# Patient Record
Sex: Female | Born: 1995 | Race: Black or African American | Hispanic: No | Marital: Single | Smoking: Never smoker
Health system: Southern US, Community
[De-identification: ages and names within clinical notes are randomized; demographics above are authoritative.]

---
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
## 2016-12-18 DIAGNOSIS — E669 Obesity, unspecified: Secondary | ICD-10-CM | POA: Insufficient documentation

## 2019-09-03 ENCOUNTER — Encounter: Payer: Self-pay | Admitting: Gerontology

## 2019-09-03 ENCOUNTER — Other Ambulatory Visit: Payer: Self-pay

## 2019-09-03 ENCOUNTER — Ambulatory Visit: Payer: Self-pay | Admitting: Gerontology

## 2019-09-03 VITALS — BP 115/70 | HR 91 | Ht 64.5 in | Wt 263.0 lb

## 2019-09-03 DIAGNOSIS — N898 Other specified noninflammatory disorders of vagina: Secondary | ICD-10-CM

## 2019-09-03 DIAGNOSIS — Z7689 Persons encountering health services in other specified circumstances: Secondary | ICD-10-CM

## 2019-09-03 NOTE — Progress Notes (Signed)
Patient ID: Vicki Guzman, female   DOB: 01-Nov-1995, 24 y.o.   MRN: [id number]  Chief Complaint  Patient presents with  . Establish Care  . Vaginal Discharge    HPI Vicki Guzman is a 24 y.o. female who presents to establish care. She reports that she's been having no malodourous copious amount of thick milky white vaginal discharge that started yesterday. She denies vaginal itch, dysuria, and pelvic pain. She states that she's sexually active with one partner and uses barrier protection. She states that she has not had Pap smear done. Overall, she states that she's doing well and offers no further complaint.  History reviewed. No pertinent past medical history.    History reviewed. No pertinent family history.  Social History Social History   Tobacco Use  . Smoking status: Never Smoker  . Smokeless tobacco: Never Used  Vaping Use  . Vaping Use: Never used  Substance Use Topics  . Alcohol use: Not Currently  . Drug use: Never    No Known Allergies  No current outpatient medications on file.   No current facility-administered medications for this visit.    Review of Systems Review of Systems  Constitutional: Negative.   HENT: Negative.   Eyes: Negative.   Respiratory: Negative.   Cardiovascular: Negative.   Gastrointestinal: Negative.   Endocrine: Negative.   Genitourinary: Positive for vaginal discharge. Negative for dysuria, flank pain, frequency, hematuria, pelvic pain, urgency, vaginal bleeding and vaginal pain.  Musculoskeletal: Negative.   Skin: Negative.   Allergic/Immunologic: Negative.   Neurological: Negative.   Hematological: Negative.   Psychiatric/Behavioral: Negative.     Blood pressure 115/70, pulse 91, height 5' 4.5" (1.638 m), weight 263 lb (119.3 kg), SpO2 98 %.  She was encouraged to continue on a weight loss regimen. Physical Exam Physical Exam Constitutional:      Appearance: Normal appearance.  HENT:     Head: Normocephalic and  atraumatic.     Nose:     Comments: Deferred per Covid protocol    Mouth/Throat:     Comments: Deferred per Covid protocol Eyes:     Extraocular Movements: Extraocular movements intact.     Pupils: Pupils are equal, round, and reactive to light.  Cardiovascular:     Rate and Rhythm: Normal rate and regular rhythm.     Pulses: Normal pulses.     Heart sounds: Normal heart sounds.  Pulmonary:     Effort: Pulmonary effort is normal.     Breath sounds: Normal breath sounds.  Abdominal:     General: Abdomen is flat. Bowel sounds are normal.     Palpations: Abdomen is soft.     Tenderness: There is no right CVA tenderness or left CVA tenderness.  Genitourinary:    Comments: Deferred per patient Musculoskeletal:        General: Normal range of motion.     Cervical back: Normal range of motion.  Skin:    General: Skin is warm and dry.  Neurological:     General: No focal deficit present.     Mental Status: She is alert and oriented to person, place, and time. Mental status is at baseline.  Psychiatric:        Mood and Affect: Mood normal.        Behavior: Behavior normal.        Thought Content: Thought content normal.        Judgment: Judgment normal.     Data Reviewed Past medical history was  reviewed.  Assessment and Plan  1. Encounter to establish care - Routine labs will be checked - CBC w/Diff; Future - Comp Met (CMET); Future - Urinalysis; Future - TSH; Future - Lipid panel; Future - HgB A1c; Future  2. Vaginal discharge - She was provided with [place] information, encouraged to call and schedule an appointment for Vaginal discharge evaluation and Pap smear screening.   Follow up: 12/03/2019 or if symptoms worsen or fails to improve.   Vicki Guzman 09/03/2019, 1:18 PM

## 2019-09-04 ENCOUNTER — Ambulatory Visit: Payer: Managed Care, Other (non HMO) | Admitting: Physician Assistant

## 2019-09-04 DIAGNOSIS — N760 Acute vaginitis: Secondary | ICD-10-CM

## 2019-09-04 DIAGNOSIS — Z113 Encounter for screening for infections with a predominantly sexual mode of transmission: Secondary | ICD-10-CM

## 2019-09-04 DIAGNOSIS — B9689 Other specified bacterial agents as the cause of diseases classified elsewhere: Secondary | ICD-10-CM

## 2019-09-04 MED ORDER — METRONIDAZOLE 500 MG PO TABS: 500.0000 mg | ORAL_TABLET | Freq: Two times a day (BID) | ORAL | 0 refills | Status: AC

## 2019-09-04 NOTE — Progress Notes (Signed)
Wet mount reviewed by provider, pt treated for BV per provider orders. Provider orders completed. 

## 2019-09-06 ENCOUNTER — Encounter: Payer: Self-pay | Admitting: Physician Assistant

## 2019-09-06 NOTE — Progress Notes (Signed)
[facility] Department STI clinic/screening visit  Subjective:  Vicki Guzman is a 24 y.o. female being seen today for an STI screening visit. The patient reports they do have symptoms.  Patient reports that they do not desire a pregnancy in the next year.   They reported they are not interested in discussing contraception today.  No LMP recorded (exact date).   Patient has the following medical conditions:   Patient Active Problem List   Diagnosis Date Noted  . Encounter to establish care 09/03/2019  . Vaginal discharge 09/03/2019  . Obesity, unspecified 12/18/2016    Chief Complaint  Patient presents with  . SEXUALLY TRANSMITTED DISEASE    screening    HPI  Patient reports that she has had a "milky" white discharge with odor for 3 days.  Denies other symptoms, chronic conditions, surgeries and regular medications.  LMP was 08/21/2019.  States that she has not had a pap yet or a HIV test in the past.  Reports that she has an appt with PCP for PE and pap within the next week or so.   See flowsheet for further details and programmatic requirements.    The following portions of the patient's history were reviewed and updated as appropriate: allergies, current medications, past medical history, past social history, past surgical history and problem list.  Objective:  There were no vitals filed for this visit.  Physical Exam Constitutional:      General: She is not in acute distress.    Appearance: Normal appearance.  HENT:     Head: Normocephalic and atraumatic.     Comments: No nits, lice, or hair loss. No cervical, supraclavicular or axillary adenopathy.    Mouth/Throat:     Mouth: Mucous membranes are moist.     Pharynx: Oropharynx is clear. No oropharyngeal exudate or posterior oropharyngeal erythema.  Eyes:     Conjunctiva/sclera: Conjunctivae normal.  Pulmonary:     Effort: Pulmonary effort is normal.  Abdominal:     Palpations: Abdomen is soft. There  is no mass.     Tenderness: There is no abdominal tenderness. There is no guarding or rebound.  Genitourinary:    General: Normal vulva.     Rectum: Normal.     Comments: External genitalia/pubic area without nits, lice, edema, erythema, lesions and inguinal adenopathy. Vagina with normal mucosa and small amount of thin, white discharge, pH=>4.5. Cervix without visible lesions. Uterus firm, mobile, nt, no masses, no CMT, no adnexal tenderness or fullness. Musculoskeletal:     Cervical back: Neck supple. No tenderness.  Skin:    General: Skin is warm and dry.     Findings: No bruising, erythema, lesion or rash.  Neurological:     Mental Status: She is alert and oriented to person, place, and time.  Psychiatric:        Mood and Affect: Mood normal.        Behavior: Behavior normal.        Thought Content: Thought content normal.        Judgment: Judgment normal.      Assessment and Plan:  Vicki Guzman is a 24 y.o. female presenting to the [facility] Department for STI screening  1. Screening for STD (sexually transmitted disease) Patient into clinic with symptoms. Rec condoms with all sex. Await test results.  Counseled that RN will call if needs to RTC for further treatment once results are back.  - WET PREP FOR TRICH, YEAST, CLUE -  Chlamydia/Gonorrhea [place] [facility] - HIV [place] [facility] - Syphilis Serology, [place] [facility] - Gonococcus culture  2. BV (bacterial vaginosis) Treat for BV with Metronidazole 500mg  #14 1 po BID for 7 days with food, no EtOH for 24 hr before and until 72 hr after completing medicine. No sex for 7 days. Rec OTC antifungal cream if has itching during or just after treatment with antibiotics. - metroNIDAZOLE (FLAGYL) 500 MG tablet; Take 1 tablet (500 mg total) by mouth 2 (two) times daily for 7 days.  Dispense: 14 tablet; Refill: 0     No follow-ups on file.  Future Appointments  Date Time Provider [place]  09/07/2019  8:40  AM [facility] PROVIDER [facility] None  09/23/2019  9:45 AM [facility] NURSE [facility] None  12/03/2019  9:30 AM [place], Lonny Prude, NP [facility] None    Jerene Dilling, PA

## 2019-09-07 ENCOUNTER — Ambulatory Visit: Payer: Self-pay

## 2019-09-07 LAB — WET PREP FOR TRICH, YEAST, CLUE
Trichomonas Exam: NEGATIVE
Yeast Exam: NEGATIVE

## 2019-09-08 ENCOUNTER — Ambulatory Visit: Payer: Self-pay

## 2019-09-09 LAB — GONOCOCCUS CULTURE

## 2019-09-10 ENCOUNTER — Telehealth: Payer: Self-pay | Admitting: Family Medicine

## 2019-09-10 NOTE — Telephone Encounter (Signed)
Call to patient to discuss + test results, no answer, unable to [facility]

## 2019-09-23 ENCOUNTER — Other Ambulatory Visit: Payer: Self-pay

## 2019-11-18 ENCOUNTER — Ambulatory Visit: Payer: Self-pay

## 2019-11-18 ENCOUNTER — Ambulatory Visit: Payer: Managed Care, Other (non HMO) | Admitting: Advanced Practice Midwife

## 2019-11-18 ENCOUNTER — Encounter: Payer: Self-pay | Admitting: Advanced Practice Midwife

## 2019-11-18 ENCOUNTER — Other Ambulatory Visit: Payer: Self-pay

## 2019-11-18 DIAGNOSIS — Z113 Encounter for screening for infections with a predominantly sexual mode of transmission: Secondary | ICD-10-CM

## 2019-11-18 LAB — WET PREP FOR TRICH, YEAST, CLUE
Trichomonas Exam: NEGATIVE
Yeast Exam: NEGATIVE

## 2019-11-18 MED ORDER — METRONIDAZOLE 500 MG PO TABS: 500.0000 mg | ORAL_TABLET | Freq: Two times a day (BID) | ORAL | 0 refills | Status: AC

## 2019-11-18 NOTE — Progress Notes (Signed)
Patient here for STD testing.Vicki Klausing Brewer-Jensen, RN 

## 2019-11-18 NOTE — Progress Notes (Signed)
[employer] results reviewed by provider Hazle Coca, CNM. Patient treated for BV per standing orders. Tawny Hopping, RN

## 2019-11-18 NOTE — Progress Notes (Signed)
[facility] Department STI clinic/screening visit  Subjective:  Vicki Guzman is a 24 y.o. SBF nullip nonsmoker female being seen today for an STI screening visit. The patient reports they do have symptoms.  Patient reports that they do not desire a pregnancy in the next year.   They reported they are not interested in discussing contraception today.  Patient's last menstrual period was 10/29/2019 (approximate).   Patient has the following medical conditions:   Patient Active Problem List   Diagnosis Date Noted  . Encounter to establish care 09/03/2019  . Vaginal discharge 09/03/2019  . Obesity, 256 lbs 12/18/2016    Chief Complaint  Patient presents with  . Exposure to STD    HPI  Patient reports last sex 11/13/19 without condom; with current partner x 3 mo.  Last EOTH 11/06/19 (3 glasses wine) on holidays/birthdays.  LMP 10/30/19.  C/o increased yellow d/c with labial edema since 11/15/19 and dysuria yesterday with clitoris tenderness  Last HIV test per patient/review of record was 09/04/19 Patient reports last pap was never  See flowsheet for further details and programmatic requirements.    The following portions of the patient's history were reviewed and updated as appropriate: allergies, current medications, past medical history, past social history, past surgical history and problem list.  Objective:  There were no vitals filed for this visit.  Physical Exam Vitals and nursing note reviewed.  Constitutional:      Appearance: Normal appearance. She is obese.  HENT:     Head: Normocephalic and atraumatic.     Mouth/Throat:     Mouth: Mucous membranes are moist.     Pharynx: Oropharynx is clear. No oropharyngeal exudate or posterior oropharyngeal erythema.  Eyes:     Conjunctiva/sclera: Conjunctivae normal.  Pulmonary:     Effort: Pulmonary effort is normal.  Abdominal:     Palpations: Abdomen is soft. There is no mass.     Tenderness: There is no  abdominal tenderness. There is no rebound.     Comments: Poor tone, soft without tenderness, increased adipose  Genitourinary:    General: Normal vulva.     Exam position: Lithotomy position.     Pubic Area: No rash or pubic lice.      Labia:        Right: No rash or lesion.        Left: No rash or lesion.      Vagina: Vaginal discharge (watery increased grey leukorrhea dripping onto table, ph>4.5) present. No erythema, bleeding or lesions.     Cervix: Normal.     Uterus: Normal.      Adnexa: Right adnexa normal and left adnexa normal.     Rectum: Normal.       Comments: Possible sebaceous cyst in middle of right labia majora Clitoris with 2 small erythematous irritated areas Posterior forchette with 3 open shallow lesions--HSV culture done on this and clitoris Lymphadenopathy:     Head:     Right side of head: No preauricular or posterior auricular adenopathy.     Left side of head: No preauricular or posterior auricular adenopathy.     Cervical: No cervical adenopathy.     Upper Body:     Right upper body: No supraclavicular or axillary adenopathy.     Left upper body: No supraclavicular or axillary adenopathy.     Lower Body: No right inguinal adenopathy. No left inguinal adenopathy.  Skin:    General: Skin is warm and dry.  Findings: No rash.  Neurological:     Mental Status: She is alert and oriented to person, place, and time.      Assessment and Plan:  CONNOR FOXWORTHY is a 24 y.o. female presenting to the [facility] Department for STI screening  1. Screening examination for venereal disease Treat wet mount per standing orders Immunization nurse consult  - HIV [place] [facility] - Gonococcus culture - HIV/HCV [place] [facility] - Syphilis Serology, [place] [facility] - Chlamydia/Gonorrhea [place] [facility] - Virology, [place] [facility] - HBV Antigen/Antibody [place] [facility] - WET PREP FOR TRICH, YEAST, CLUE     Return if symptoms worsen or fail to improve.  Future  Appointments  Date Time Provider Department Center  12/03/2019  9:30 AM Rolm Gala, NP [facility] None    Alberteen Spindle, CNM

## 2019-11-23 LAB — GONOCOCCUS CULTURE

## 2019-11-25 ENCOUNTER — Encounter: Payer: Self-pay | Admitting: Family Medicine

## 2019-11-25 LAB — HM HIV SCREENING LAB: HM HIV Screening: NEGATIVE

## 2019-11-25 LAB — HEPATITIS B SURFACE ANTIGEN

## 2019-11-25 LAB — HM HEPATITIS C SCREENING LAB: HM Hepatitis Screen: NEGATIVE

## 2019-11-27 ENCOUNTER — Telehealth: Payer: Self-pay | Admitting: Family Medicine

## 2019-11-27 ENCOUNTER — Other Ambulatory Visit: Payer: Self-pay

## 2019-11-27 ENCOUNTER — Other Ambulatory Visit: Payer: Self-pay | Admitting: Physician Assistant

## 2019-11-27 ENCOUNTER — Ambulatory Visit: Payer: Managed Care, Other (non HMO)

## 2019-11-27 DIAGNOSIS — A749 Chlamydial infection, unspecified: Secondary | ICD-10-CM

## 2019-11-27 DIAGNOSIS — B009 Herpesviral infection, unspecified: Secondary | ICD-10-CM

## 2019-11-27 MED ORDER — ACYCLOVIR 800 MG PO TABS: 800.0000 mg | ORAL_TABLET | Freq: Two times a day (BID) | ORAL | 12 refills | Status: AC

## 2019-11-27 NOTE — Telephone Encounter (Signed)
TC with patient.  Verified ID via password.  Discussed +HSV 2 results, disease process and treatment options. Questions and concerns addressed. Patient verbalized understanding of results. Requests Acyclovir be called into [facility] [address] in [place].  Explained treatment options suppressive vs. Episodically.  Patient wants episodic treatment.   Wendi Snipes, RN

## 2019-11-27 NOTE — Progress Notes (Signed)
Per note from RN after discussing results with patient.  Patient requests Rx for episodic treatment be sent to her pharmacy[facility] [place].  Rx for Acyclovir 800 mg #10 1 po BID for 5 days with refills for 1 year sent to pharmacy.

## 2019-12-01 DIAGNOSIS — A749 Chlamydial infection, unspecified: Secondary | ICD-10-CM

## 2019-12-01 MED ORDER — AZITHROMYCIN 500 MG PO TABS
1000.0000 mg | ORAL_TABLET | Freq: Once | ORAL | Status: AC
  Administered 2019-12-01: 1000 mg via ORAL

## 2019-12-03 ENCOUNTER — Ambulatory Visit: Payer: Self-pay | Admitting: Gerontology

## 2020-02-05 ENCOUNTER — Ambulatory Visit: Payer: Managed Care, Other (non HMO)

## 2020-02-08 ENCOUNTER — Ambulatory Visit: Payer: Managed Care, Other (non HMO)

## 2020-02-09 NOTE — Addendum Note (Signed)
Addended by: Heywood Bene on: 02/09/2020 11:03 AM   Modules accepted: Orders

## 2020-12-05 ENCOUNTER — Emergency Department
Admission: EM | Admit: 2020-12-05 | Discharge: 2020-12-05 | Disposition: A | Discharge status: Home / Self Care | Payer: No Typology Code available for payment source | Attending: Emergency Medicine | Admitting: Emergency Medicine

## 2020-12-05 ENCOUNTER — Emergency Department: Payer: No Typology Code available for payment source

## 2020-12-05 ENCOUNTER — Other Ambulatory Visit: Payer: Self-pay

## 2020-12-05 DIAGNOSIS — S34109A Unspecified injury to unspecified level of lumbar spinal cord, initial encounter: Secondary | ICD-10-CM | POA: Diagnosis present

## 2020-12-05 DIAGNOSIS — S8002XA Contusion of left knee, initial encounter: Secondary | ICD-10-CM | POA: Diagnosis not present

## 2020-12-05 DIAGNOSIS — Y92410 Unspecified street and highway as the place of occurrence of the external cause: Secondary | ICD-10-CM | POA: Diagnosis not present

## 2020-12-05 DIAGNOSIS — S39012A Strain of muscle, fascia and tendon of lower back, initial encounter: Secondary | ICD-10-CM | POA: Diagnosis not present

## 2020-12-05 LAB — POC URINE PREG, ED: Preg Test, Ur: NEGATIVE

## 2020-12-05 MED ORDER — MELOXICAM 15 MG PO TABS: 15.0000 mg | ORAL_TABLET | Freq: Every day | ORAL | 0 refills | Status: DC

## 2020-12-05 MED ORDER — METHOCARBAMOL 500 MG PO TABS: 500.0000 mg | ORAL_TABLET | Freq: Four times a day (QID) | ORAL | 0 refills | Status: DC
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
## 2020-12-09 ENCOUNTER — Emergency Department
Admission: EM | Admit: 2020-12-09 | Discharge: 2020-12-09 | Disposition: A | Discharge status: Home / Self Care | Payer: No Typology Code available for payment source | Attending: Emergency Medicine | Admitting: Emergency Medicine

## 2020-12-09 ENCOUNTER — Other Ambulatory Visit: Payer: Self-pay

## 2020-12-09 ENCOUNTER — Emergency Department: Payer: No Typology Code available for payment source

## 2020-12-09 DIAGNOSIS — Y92410 Unspecified street and highway as the place of occurrence of the external cause: Secondary | ICD-10-CM | POA: Diagnosis not present

## 2020-12-09 DIAGNOSIS — Z23 Encounter for immunization: Secondary | ICD-10-CM | POA: Diagnosis not present

## 2020-12-09 DIAGNOSIS — M79672 Pain in left foot: Secondary | ICD-10-CM

## 2020-12-09 DIAGNOSIS — S91312A Laceration without foreign body, left foot, initial encounter: Secondary | ICD-10-CM | POA: Diagnosis present

## 2020-12-09 MED ORDER — TETANUS-DIPHTH-ACELL PERTUSSIS 5-2.5-18.5 LF-MCG/0.5 IM SUSY
0.5000 mL | PREFILLED_SYRINGE | Freq: Once | INTRAMUSCULAR | Status: AC
  Administered 2020-12-09: 0.5 mL via INTRAMUSCULAR
  Filled 2020-12-09: qty 0.5
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
## 2022-05-19 IMAGING — CR DG FOOT COMPLETE 3+V*L*
1 series · 3 of 3 positions shown · non-contrast
Comparison: None.

CLINICAL DATA: Left foot pain.  Pinky toe pain.

EXAM:
LEFT FOOT - COMPLETE 3+ VIEW

[Series 1: dg foot complete left · 0.14mm/px · 3 of 3 slices shown]
[im 1/3]
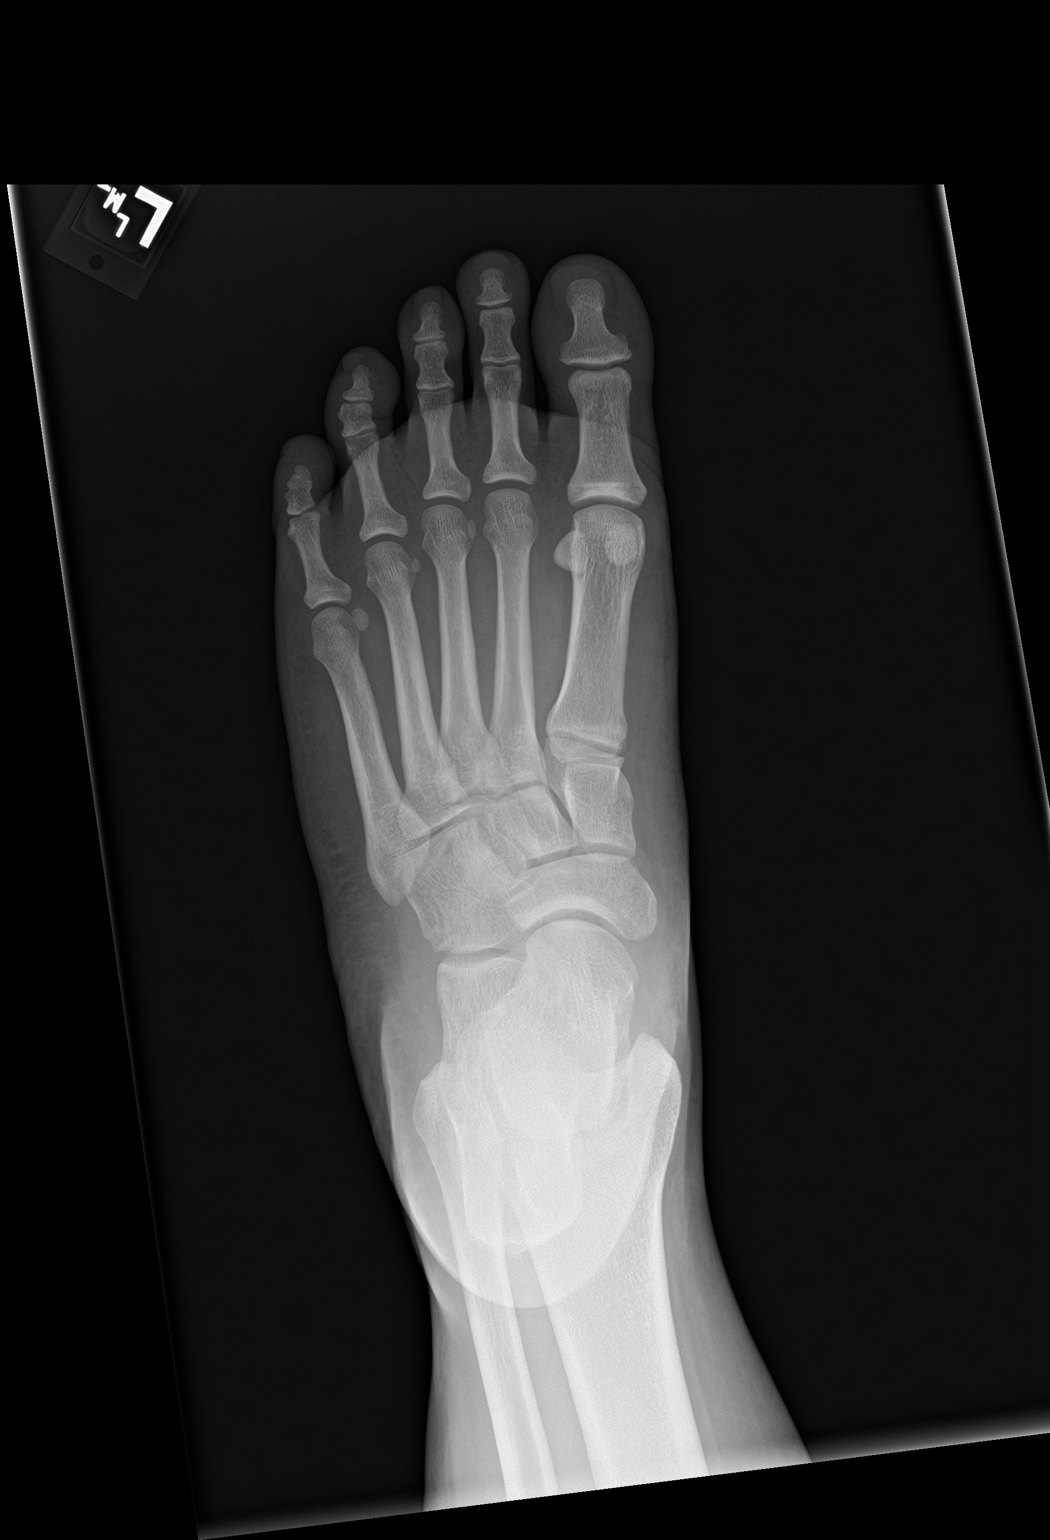
[im 2/3]
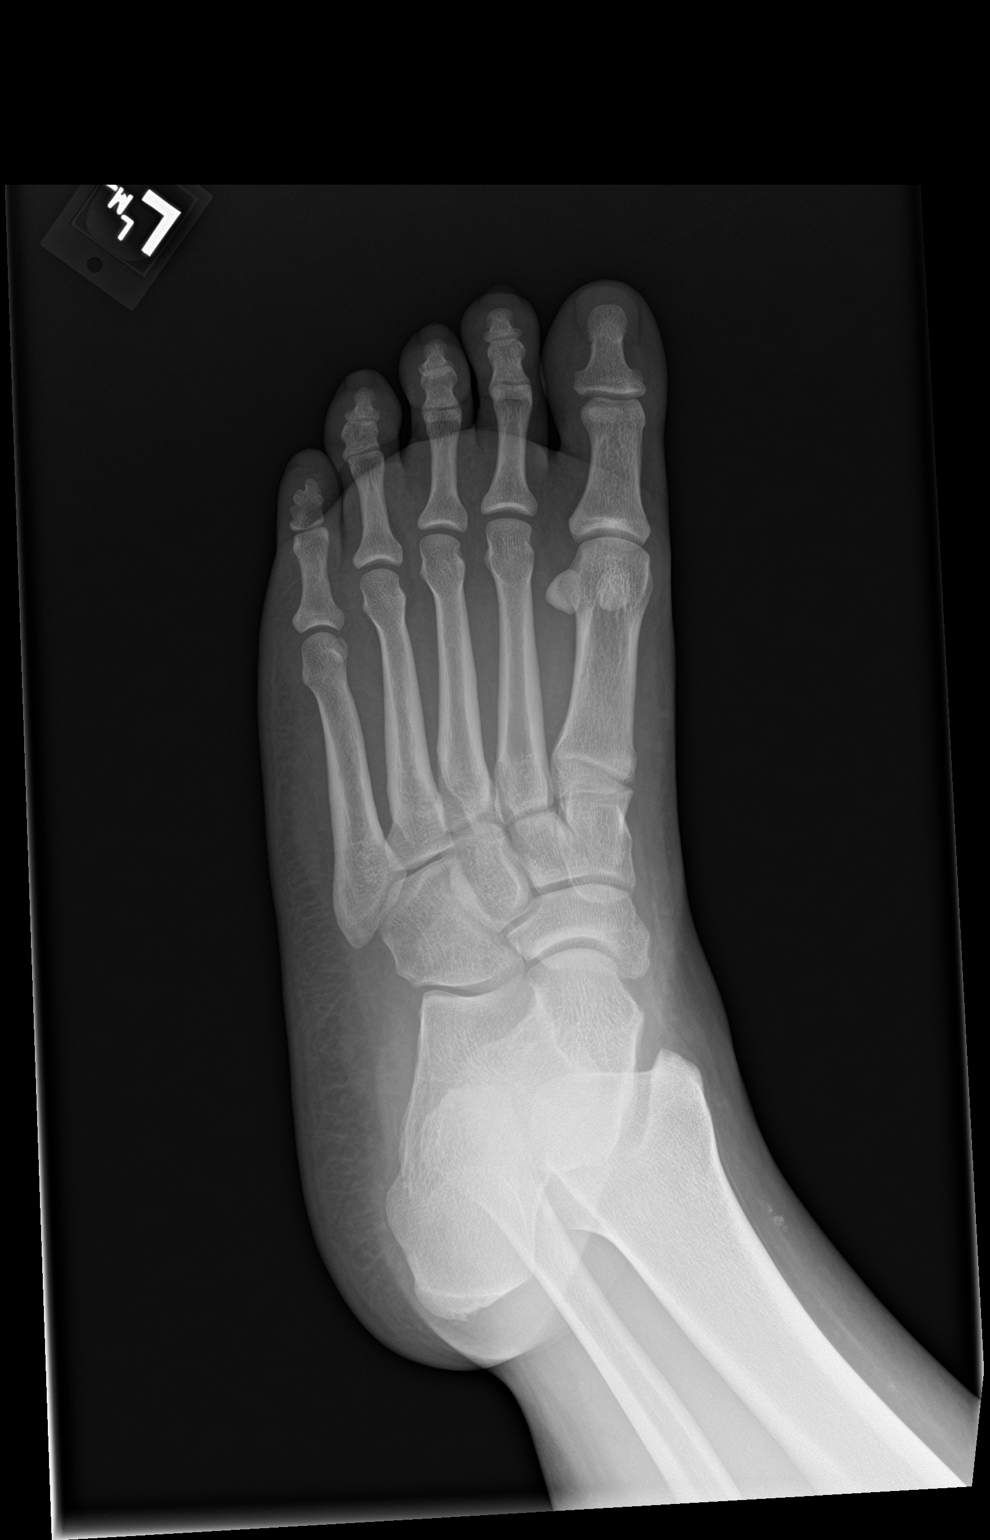
[im 3/3]
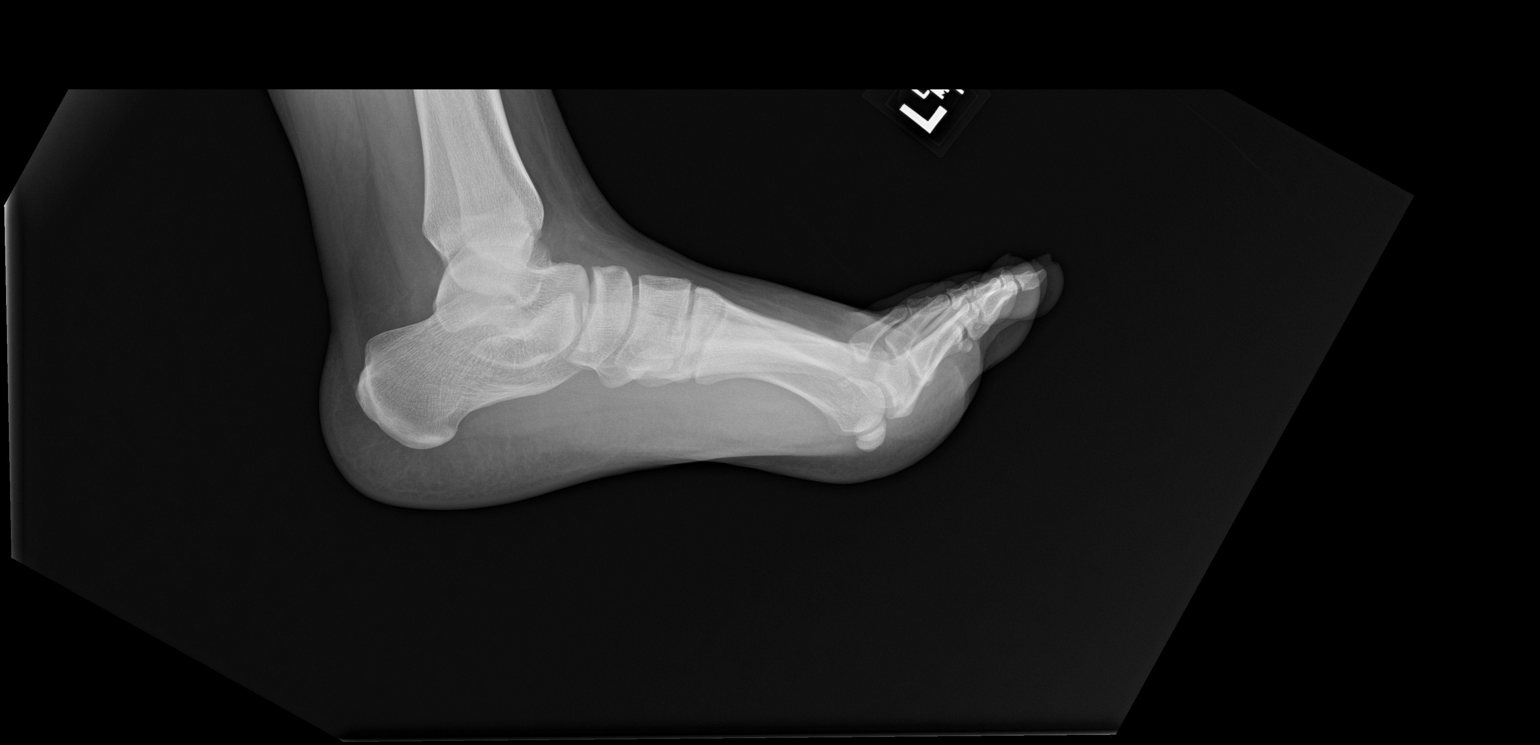

[3 of 3 positions shown; findings below may reference images not displayed]

FINDINGS: No fracture. No subluxation or dislocation. No worrisome lytic or
sclerotic osseous abnormality. No evidence for radiopaque soft
tissue foreign body.
IMPRESSION: Negative.

## 2023-07-02 ENCOUNTER — Other Ambulatory Visit: Payer: Self-pay

## 2023-07-02 ENCOUNTER — Emergency Department
Admission: EM | Admit: 2023-07-02 | Discharge: 2023-07-02 | Disposition: A | Discharge status: Home / Self Care | Attending: Emergency Medicine | Admitting: Emergency Medicine

## 2023-07-02 ENCOUNTER — Emergency Department

## 2023-07-02 DIAGNOSIS — W2211XA Striking against or struck by driver side automobile airbag, initial encounter: Secondary | ICD-10-CM | POA: Diagnosis not present

## 2023-07-02 DIAGNOSIS — M542 Cervicalgia: Secondary | ICD-10-CM | POA: Diagnosis not present

## 2023-07-02 DIAGNOSIS — T2102XA Burn of unspecified degree of abdominal wall, initial encounter: Secondary | ICD-10-CM | POA: Diagnosis present

## 2023-07-02 DIAGNOSIS — Y92410 Unspecified street and highway as the place of occurrence of the external cause: Secondary | ICD-10-CM | POA: Insufficient documentation

## 2023-07-02 DIAGNOSIS — T2122XA Burn of second degree of abdominal wall, initial encounter: Secondary | ICD-10-CM | POA: Diagnosis not present

## 2023-07-02 DIAGNOSIS — R109 Unspecified abdominal pain: Secondary | ICD-10-CM

## 2023-07-02 LAB — URINALYSIS, W/ REFLEX TO CULTURE (INFECTION SUSPECTED)
Bacteria, UA: NONE SEEN
Bilirubin Urine: NEGATIVE
Glucose, UA: NEGATIVE mg/dL
Ketones, ur: NEGATIVE mg/dL
Leukocytes,Ua: NEGATIVE
Nitrite: NEGATIVE
Protein, ur: NEGATIVE mg/dL
Specific Gravity, Urine: 1.025 (ref 1.005–1.030)
pH: 5 (ref 5.0–8.0)

## 2023-07-02 LAB — POC URINE PREG, ED: Preg Test, Ur: NEGATIVE

## 2023-07-02 MED ORDER — BACLOFEN 10 MG PO TABS: 10.0000 mg | ORAL_TABLET | Freq: Three times a day (TID) | ORAL | 0 refills | Status: AC

## 2023-07-02 MED ORDER — SILVER SULFADIAZINE 1 % EX CREA
TOPICAL_CREAM | Freq: Once | CUTANEOUS | Status: AC
  Filled 2023-07-02: qty 20

## 2023-07-02 MED ORDER — NAPROXEN 500 MG PO TABS: 500.0000 mg | ORAL_TABLET | Freq: Two times a day (BID) | ORAL | 2 refills | Status: AC

## 2023-07-02 NOTE — ED Notes (Signed)
 Pt in xray
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
## 2023-07-05 ENCOUNTER — Emergency Department
Admission: EM | Admit: 2023-07-05 | Discharge: 2023-07-05 | Disposition: A | Discharge status: Home / Self Care | Attending: Emergency Medicine | Admitting: Emergency Medicine

## 2023-07-05 ENCOUNTER — Other Ambulatory Visit: Payer: Self-pay

## 2023-07-05 DIAGNOSIS — M546 Pain in thoracic spine: Secondary | ICD-10-CM | POA: Diagnosis present

## 2023-07-05 DIAGNOSIS — Y92410 Unspecified street and highway as the place of occurrence of the external cause: Secondary | ICD-10-CM | POA: Diagnosis not present

## 2023-07-05 MED ORDER — KETOROLAC TROMETHAMINE 30 MG/ML IJ SOLN
30.0000 mg | Freq: Once | INTRAMUSCULAR | Status: AC
  Administered 2023-07-05: 30 mg via INTRAMUSCULAR
  Filled 2023-07-05: qty 1

## 2023-07-05 MED ORDER — LIDOCAINE 5 % EX PTCH: 1.0000 | MEDICATED_PATCH | Freq: Two times a day (BID) | CUTANEOUS | 0 refills | Status: AC

## 2023-07-05 MED ORDER — CYCLOBENZAPRINE HCL 5 MG PO TABS: 5.0000 mg | ORAL_TABLET | Freq: Three times a day (TID) | ORAL | 0 refills | Status: AC | PRN
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
